# Patient Record
Sex: Female | Born: 1985 | Race: White | Hispanic: No | Marital: Single | Smoking: Current every day smoker
Health system: Southern US, Community
[De-identification: ages and names within clinical notes are randomized; demographics above are authoritative.]

## PROBLEM LIST (undated history)

## (undated) HISTORY — PX: OTHER SURGICAL HISTORY: SHX169

---

## 2010-11-01 ENCOUNTER — Inpatient Hospital Stay (HOSPITAL_COMMUNITY)
Admission: AD | Admit: 2010-11-01 | Discharge: 2010-11-04 | DRG: 775 | Disposition: A | Discharge status: Home / Self Care | Payer: Medicaid Other | Source: Ambulatory Visit | Attending: Obstetrics and Gynecology | Admitting: Obstetrics and Gynecology

## 2010-11-01 LAB — CBC
MCV: 94.7 fL (ref 78.0–100.0)
Platelets: 218 10*3/uL (ref 150–400)
RBC: 3.98 MIL/uL (ref 3.87–5.11)
WBC: 11.4 10*3/uL — ABNORMAL HIGH (ref 4.0–10.5)

## 2010-11-02 LAB — RPR: RPR Ser Ql: NONREACTIVE

## 2010-11-02 LAB — ABO/RH: ABO/RH(D): B NEG

## 2010-11-03 LAB — CBC
Platelets: 231 10*3/uL (ref 150–400)
RBC: 3.49 MIL/uL — ABNORMAL LOW (ref 3.87–5.11)
WBC: 13.6 10*3/uL — ABNORMAL HIGH (ref 4.0–10.5)

## 2010-11-23 ENCOUNTER — Inpatient Hospital Stay (HOSPITAL_COMMUNITY): Admission: AD | Admit: 2010-11-23 | Payer: Self-pay | Source: Home / Self Care | Admitting: Obstetrics & Gynecology

## 2014-08-29 ENCOUNTER — Inpatient Hospital Stay: Payer: Self-pay | Admitting: Surgery

## 2014-08-29 LAB — BASIC METABOLIC PANEL
ANION GAP: 6 — AB (ref 7–16)
BUN: 12 mg/dL (ref 7–18)
CALCIUM: 8.6 mg/dL (ref 8.5–10.1)
CO2: 26 mmol/L (ref 21–32)
CREATININE: 0.73 mg/dL (ref 0.60–1.30)
Chloride: 105 mmol/L (ref 98–107)
EGFR (Non-African Amer.): >60
Glucose: 86 mg/dL (ref 65–99)
OSMOLALITY: 273 (ref 275–301)
Potassium: 3.9 mmol/L (ref 3.5–5.1)
Sodium: 137 mmol/L (ref 136–145)

## 2014-08-29 LAB — TROPONIN I: Troponin-I: <0.02 ng/mL

## 2014-08-29 LAB — CBC
HCT: 41.5 % (ref 35.0–47.0)
HGB: 13.6 g/dL (ref 12.0–16.0)
MCH: 31.9 pg (ref 26.0–34.0)
MCHC: 32.7 g/dL (ref 32.0–36.0)
MCV: 98 fL (ref 80–100)
PLATELETS: 206 10*3/uL (ref 150–440)
RBC: 4.26 10*6/uL (ref 3.80–5.20)
RDW: 14.1 % (ref 11.5–14.5)
WBC: 7.5 10*3/uL (ref 3.6–11.0)

## 2014-09-11 ENCOUNTER — Ambulatory Visit: Payer: Self-pay | Admitting: Cardiothoracic Surgery

## 2014-09-15 ENCOUNTER — Ambulatory Visit: Payer: Self-pay | Admitting: Cardiothoracic Surgery

## 2014-10-14 ENCOUNTER — Ambulatory Visit
Admit: 2014-10-14 | Disposition: A | Discharge status: Home / Self Care | Payer: Self-pay | Attending: Cardiothoracic Surgery | Admitting: Cardiothoracic Surgery

## 2014-12-14 NOTE — H&P (Signed)
PATIENT NAME:  Kendra Horne, Kendra Horne MR#:  [id number] DATE OF BIRTH:  September 26, 1985  DATE OF ADMISSION:  08/29/2014  CHIEF COMPLAINT: Left chest pain and shortness of breath.   HISTORY OF PRESENT ILLNESS: This is a patient who is undergoing what she calls chest pain and points to her apex and back. Denies lateral pain. A workup in the Emergency Room suggested an apical pneumothorax, and I was asked to see the patient for possible chest tube placement.  She describes no prior episode. This started yesterday at 2100 hours acutely. She is a smoker but has not been diagnosed with COPD. She smokes 1/2 pack of cigarettes per day. At this point, she states that she is short of breath but does not appear short of breath. Denies hemoptysis. No abdominal pain.   PAST MEDICAL HISTORY: None.   PAST SURGICAL HISTORY: None.   ALLERGIES: None.   MEDICATIONS: None.   FAMILY HISTORY: No history of pneumothorax or COPD.   SOCIAL HISTORY: The patient works at [employer], smokes 1/2 pack of cigarettes per day, does not drink much alcohol.  REVIEW OF SYSTEMS: A complete system review was performed and negative with the exception of that mentioned in the HPI.   PHYSICAL EXAMINATION:  GENERAL: Healthy, comfortable-appearing female patient. She does not appear in any acute distress and does not appear to be short of breath. Her BMI is 17, 114 pounds, 68 inches tall.  VITAL SIGNS: Show a temperature of 98.7, pulse of 98, respirations 24, blood pressure 119/64, 97% room air saturation. Pain scale of 5.  HEENT: Shows very poor dentition. No scleral icterus.  NECK: No palpable neck nodes. CHEST: Fairly clear to auscultation. There are some rhonchi on the left that clear with coughing. There are diminished breath sounds in the left apex.  CARDIAC: Regular rate and rhythm.  ABDOMEN: Soft, nontender.  EXTREMITIES AND MUSCULOSKELETAL: Within normal limits. Nontender calves.  INTEGUMENT: Shows no jaundice.  NEUROLOGIC:  Grossly intact.   DIAGNOSTIC DATA: Chest x-ray is personally and independently reviewed, showing an apical left pneumothorax.   LABORATORY VALUES: Within normal limits.   ASSESSMENT AND PLAN: This is a patient with a left pneumothorax, spontaneous in nature. It involved mostly the apex. I plan to place a chest tube. I discussed with her and her significant other the rationale for this, the options of observation, the risks of bleeding, infection, inability to completely reinflate the lung, and the need for additional surgery or additional chest tubes. This was all reviewed for her. She understood and agreed to proceed.   ____________________________ Adah Salvageichard E. Excell Seltzerooper, MD rec:ST D: 08/29/2014 21:30:22 ET T: 08/29/2014 22:43:01 ET JOB#: [id number]  cc: Adah Salvageichard E. Excell Seltzerooper, MD, <Dictator> Lattie HawICHARD E Keyonna Comunale MD ELECTRONICALLY SIGNED 08/30/2014 1:00

## 2014-12-14 NOTE — Op Note (Signed)
PATIENT NAME:  Kendra PlaterLANGLEY, Charita MR#:  [id number] DATE OF BIRTH:  1985/11/21  DATE OF PROCEDURE:  08/29/2014  PREOPERATIVE DIAGNOSIS: Left apical pneumothorax.   POSTOPERATIVE DIAGNOSIS: Left apical pneumothorax.  PROCEDURE: Left apical chest tube placement.   SURGEON: Orilla Templeman E. Excell Seltzerooper, MD   ANESTHESIA: Local anesthetic.   INDICATIONS: This is a patient with an acute spontaneous pneumothorax involving mostly the apex. Preoperatively, we discussed rationale for placing the chest tube the options of observation, risk of bleeding, infection, recurrence, inability to completely expand the lung necessitating either surgery or additional chest tube placement. This was all reviewed for her. She understood and agreed to proceed.   FINDINGS: Reinflation of most of the lung post chest film with prompt release of her pneumothorax and improvement of her chest pain and shortness of breath.   DESCRIPTION OF PROCEDURE: The patient was identified in the Emergency Room. Chest x-ray was reviewed personally and independently. A timeout was performed, and then she was prepped and draped in a sterile fashion. Local anesthetic was infiltrated in skin and subcutaneous tissues around the apex near the third rib midclavicular line. The needle was advanced into the pleural cavity with release of air signifying the presence and entry into the pneumothorax.   An incision was made and then a 12 French trocar catheter was placed directly into the pleural cavity and directed towards the apex. It was sutured in place. There was prompt removal of air from the pleural cavity. It was placed to Pleur-evac suction where an air leak was noted. A sterile dressing was placed.   The patient tolerated this procedure well. A postoperative chest film was ordered and personally reviewed. The patient will be admitted to the hospital for continued pneumothorax care with Pleur-evac.    ____________________________ Adah Salvageichard E. Excell Seltzerooper,  MD rec:bm D: 08/29/2014 21:35:39 ET T: 08/30/2014 01:20:04 ET JOB#: [id number]  cc: Adah Salvageichard E. Excell Seltzerooper, MD, <Dictator> Lattie HawICHARD E Henri Baumler MD ELECTRONICALLY SIGNED 08/30/2014 19:31

## 2014-12-14 NOTE — H&P (Signed)
Subjective/Chief Complaint left cp, SOB   History of Present Illness acute onset yest at 2100 SOB, left apical ant chest pain no prior episode   Past History PMH none PSH none   Past Medical Health Smoking   Past Med/Surgical Hx:  Denies:   ALLERGIES:  No Known Allergies:   Family and Social History:  Family History Negative  Smoking   Social History positive  tobacco, negative ETOH, [employer]   + Tobacco Current (within 1 year)   Review of Systems:  Fever/Chills No   Cough Yes   Sputum No   Abdominal Pain No   Diarrhea No   Constipation No   Nausea/Vomiting No   SOB/DOE Yes   Chest Pain Yes   Dysuria No   Tolerating Diet No   Medications/Allergies Reviewed Medications/Allergies reviewed   Physical Exam:  GEN no acute distress, thin   HEENT pink conjunctivae, PERRL, poor dentition   NECK supple   RESP normal resp effort  no use of accessory muscles  rhonchi  dec BS left apex   CARD regular rate   ABD denies tenderness   LYMPH negative neck, negative axillae   EXTR negative edema   SKIN normal to palpation, tattoos   PSYCH alert, A+O to time, place, person, good insight   Lab Results: Routine Chem:  15-Jan-16 15:19   Glucose, Serum 86  BUN 12  Creatinine (comp) 0.73  Sodium, Serum 137  Potassium, Serum 3.9  Chloride, Serum 105  CO2, Serum 26  Calcium (Total), Serum 8.6  Anion Gap  6  Osmolality (calc) 273  eGFR (African American) >60  eGFR (Non-African American) >60 (eGFR values <15mL/min/1.73 m2 may be an indication of chronic kidney disease (CKD). Calculated eGFR, using the MRDR Study equation, is useful in  patients with stable renal function. The eGFR calculation will not be reliable in acutely ill patients when serum creatinine is changing rapidly. It is not useful in patients on dialysis. The eGFR calculation may not be applicable to patients at the low and high extremes of body sizes, pregnant women, and  vegetarians.)  Cardiac:  15-Jan-16 15:19   Troponin I < 0.02 (0.00-0.05 0.05 ng/mL or less: NEGATIVE  Repeat testing in 3-6 hrs  if clinically indicated. >0.05 ng/mL: POTENTIAL  MYOCARDIAL INJURY. Repeat  testing in 3-6 hrs if  clinically indicated. NOTE: An increase or decrease  of 30% or more on serial  testing suggests a  clinically important change)  Routine Hem:  15-Jan-16 15:19   WBC (CBC) 7.5  RBC (CBC) 4.26  Hemoglobin (CBC) 13.6  Hematocrit (CBC) 41.5  Platelet Count (CBC) 206 (Result(s) reported on 29 Aug 2014 at 03:37PM.)  MCV 98  MCH 31.9  MCHC 32.7  RDW 14.1   Radiology Results: XRay:    15-Jan-16 15:49, Chest PA and Lateral  Chest PA and Lateral  REASON FOR EXAM:    chest pain/cough  COMMENTS:       PROCEDURE: DXR - DXR CHEST PA (OR AP) AND LATERAL  - Aug 29 2014  3:49PM     CLINICAL DATA:  Left-sided chest pain which began yesterday. Pain  with movement of the left arm. Current smoker.    EXAM:  CHEST  2 VIEW    COMPARISON:  None.    FINDINGS:  Left apical, lateral and basilar pneumothorax on the order of 30% or  so, without evidence of tension. The left lung is tethered to the  lateral pleura by scarring. Hyperinflation and  emphysematous changes  throughout both lungs. Mild central peribronchial thickening. Lungs  otherwise clear. No pleural effusions. Cardiomediastinal silhouette  unremarkable. Visualized bony thorax intact with slight  thoracolumbar scoliosis convex right.     IMPRESSION:  1. Spontaneous left pneumothorax on the order of 30% or so, without  tension.  2. COPD/emphysema.  No acute cardiopulmonary disease otherwise.      Electronically Signed    By: Evangeline Dakin M.D.    On: 08/29/2014 16:04         Verified By: Deniece Portela, M.D.,    Assessment/Admission Diagnosis personal rev of CXR; left PTX, apical rec CT risks and optiobns rev'd agrees with plan   Electronic Signatures: Florene Glen (MD)   (Signed 15-Jan-16 20:45)  Authored: CHIEF COMPLAINT and HISTORY, PAST MEDICAL/SURGIAL HISTORY, ALLERGIES, FAMILY AND SOCIAL HISTORY, REVIEW OF SYSTEMS, PHYSICAL EXAM, LABS, Radiology, ASSESSMENT AND PLAN   Last Updated: 15-Jan-16 20:45 by Florene Glen (MD)

## 2014-12-14 NOTE — Discharge Summary (Signed)
PATIENT NAME:  Kendra PlaterLANGLEY, Sayuri MR#:  [id number] DATE OF BIRTH:  07/21/1986  DATE OF ADMISSION:  08/29/2014 DATE OF DISCHARGE:  09/02/2014  ADMITTING DIAGNOSIS:  Spontaneous pneumothorax.   DISCHARGE DIAGNOSIS:  Spontaneous pneumothorax.   OPERATION PERFORMED: Insertion of left-sided chest tube.   HOSPITAL COURSE:  Kendra PlaterBrittany Lindseth is a 29 year old white female who was admitted to the hospital on 08/29/2014, after experiencing acute onset of shortness of breath and left-sided chest pain. A chest x-ray confirmed the presence of a large pneumothorax on the left. A chest tube was inserted with prompt re-expansion of her lung. Over the next several days her air leak completely stopped and a chest x-ray with the chest tube on water seal confirmed the lung to be fully expanded. The chest tube was removed and upon removal the patient was discharged to home. At the time of discharge, her chest x-ray showed the lung to be fully inflated. She will follow up with Dr. Thelma Bargeaks in one week. She will obtain a chest x-ray at that time.   DISCHARGE MEDICATIONS: Included acetaminophen, oxycodone 325/10 one tablet orally every four hours as needed.  She was on no other medications.    ____________________________ Sheppard Plumberimothy E. Thelma Bargeaks, MD teo:at D: 09/03/2014 09:40:43 ET T: 09/03/2014 10:07:50 ET JOB#: [id number]  cc: Marcial Pacasimothy E. Thelma Bargeaks, MD, <Dictator> Jasmine DecemberIMOTHY E Tunis Gentle MD ELECTRONICALLY SIGNED 09/03/2014 10:40

## 2016-10-24 IMAGING — CR DG CHEST 1V PORT
1 series · 1 of 1 positions shown · non-contrast
Comparison: One day prior

CLINICAL DATA: Subsequent encounter for chest tube and
pneumothorax.

EXAM:
PORTABLE CHEST - 1 VIEW

[ap]
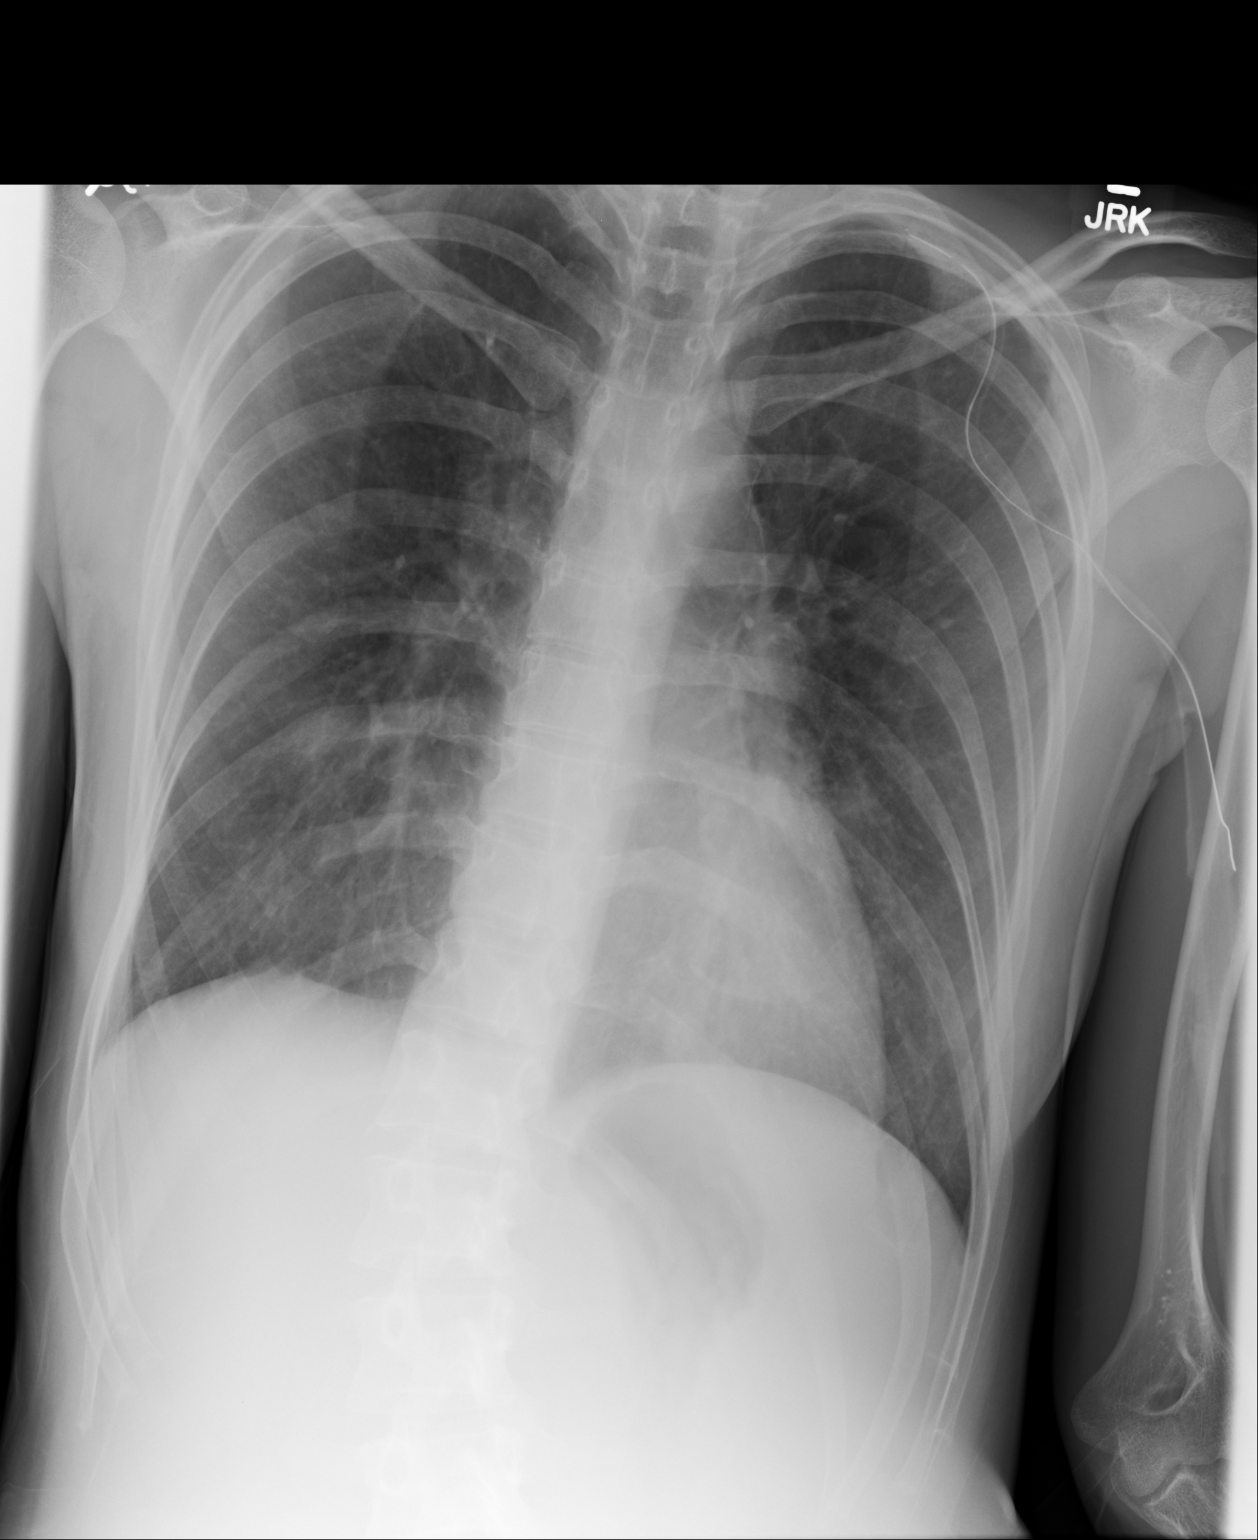

[1 of 1 positions shown; findings below may reference images not displayed]

FINDINGS: Left chest tube is unchanged in position. Tiny left apical and
medial pneumothorax again identified.

Midline trachea. Normal heart size. No pleural fluid. Right apical
pleural thickening. Diffuse peribronchial thickening. No lobar
consolidation.
IMPRESSION: Left chest tube remaining in place with similar tiny left apical
pneumothorax.

Redemonstration of coarsened interstitial markings. Considerations
include age advanced chronic bronchitis related to emphysema and/or
asthma.

## 2016-11-04 IMAGING — CR DG CHEST 2V
1 series · 2 of 2 positions shown · non-contrast
Comparison: Chest x-ray of 09/01/2014

CLINICAL DATA: History of pneumothorax, some shortness of breath
today, former smoking history

EXAM:
CHEST  2 VIEW

[Series 1: dxr chest pa (or ap) and lateral · 0.14mm/px · 2 of 2 slices shown]
[im 1/2]
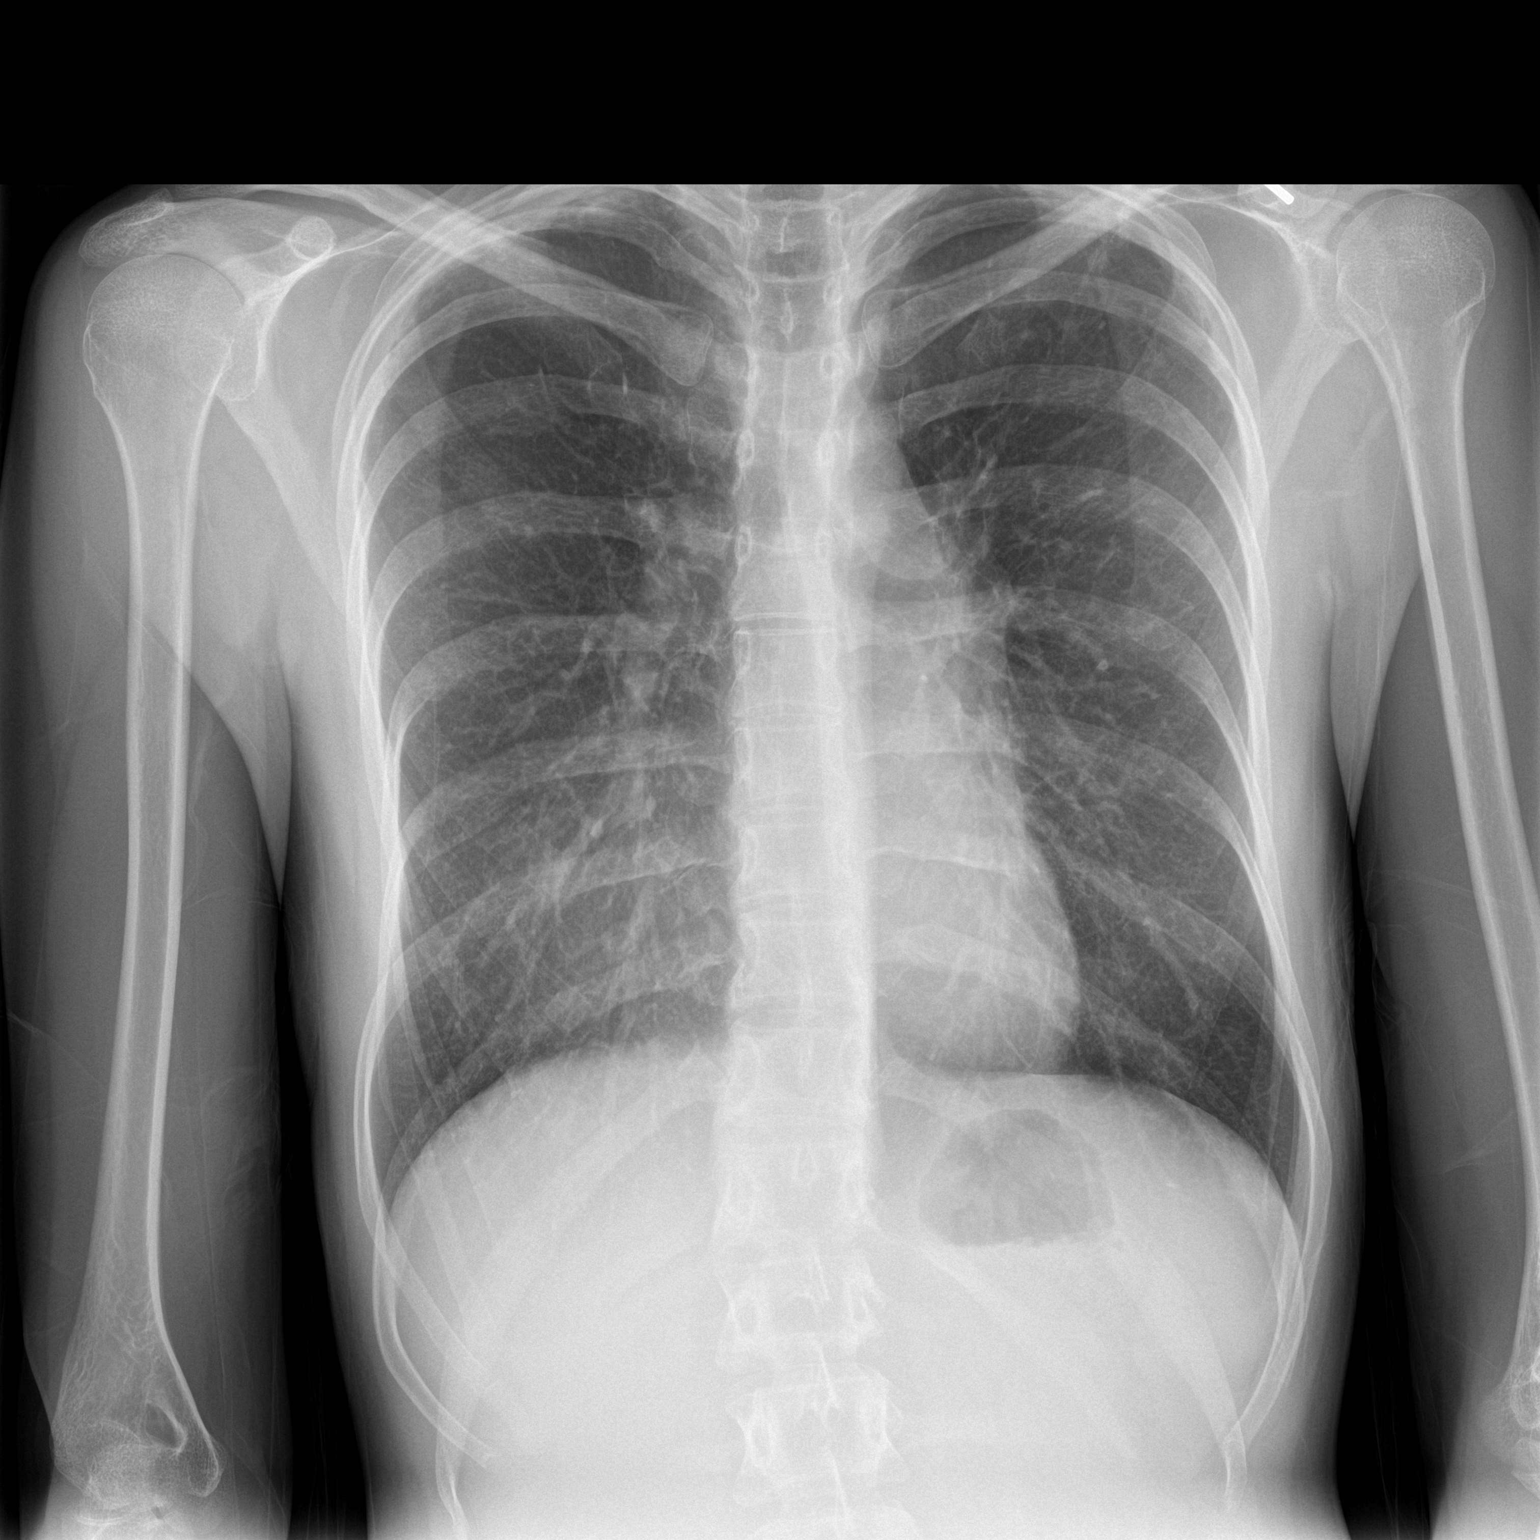
[im 2/2]
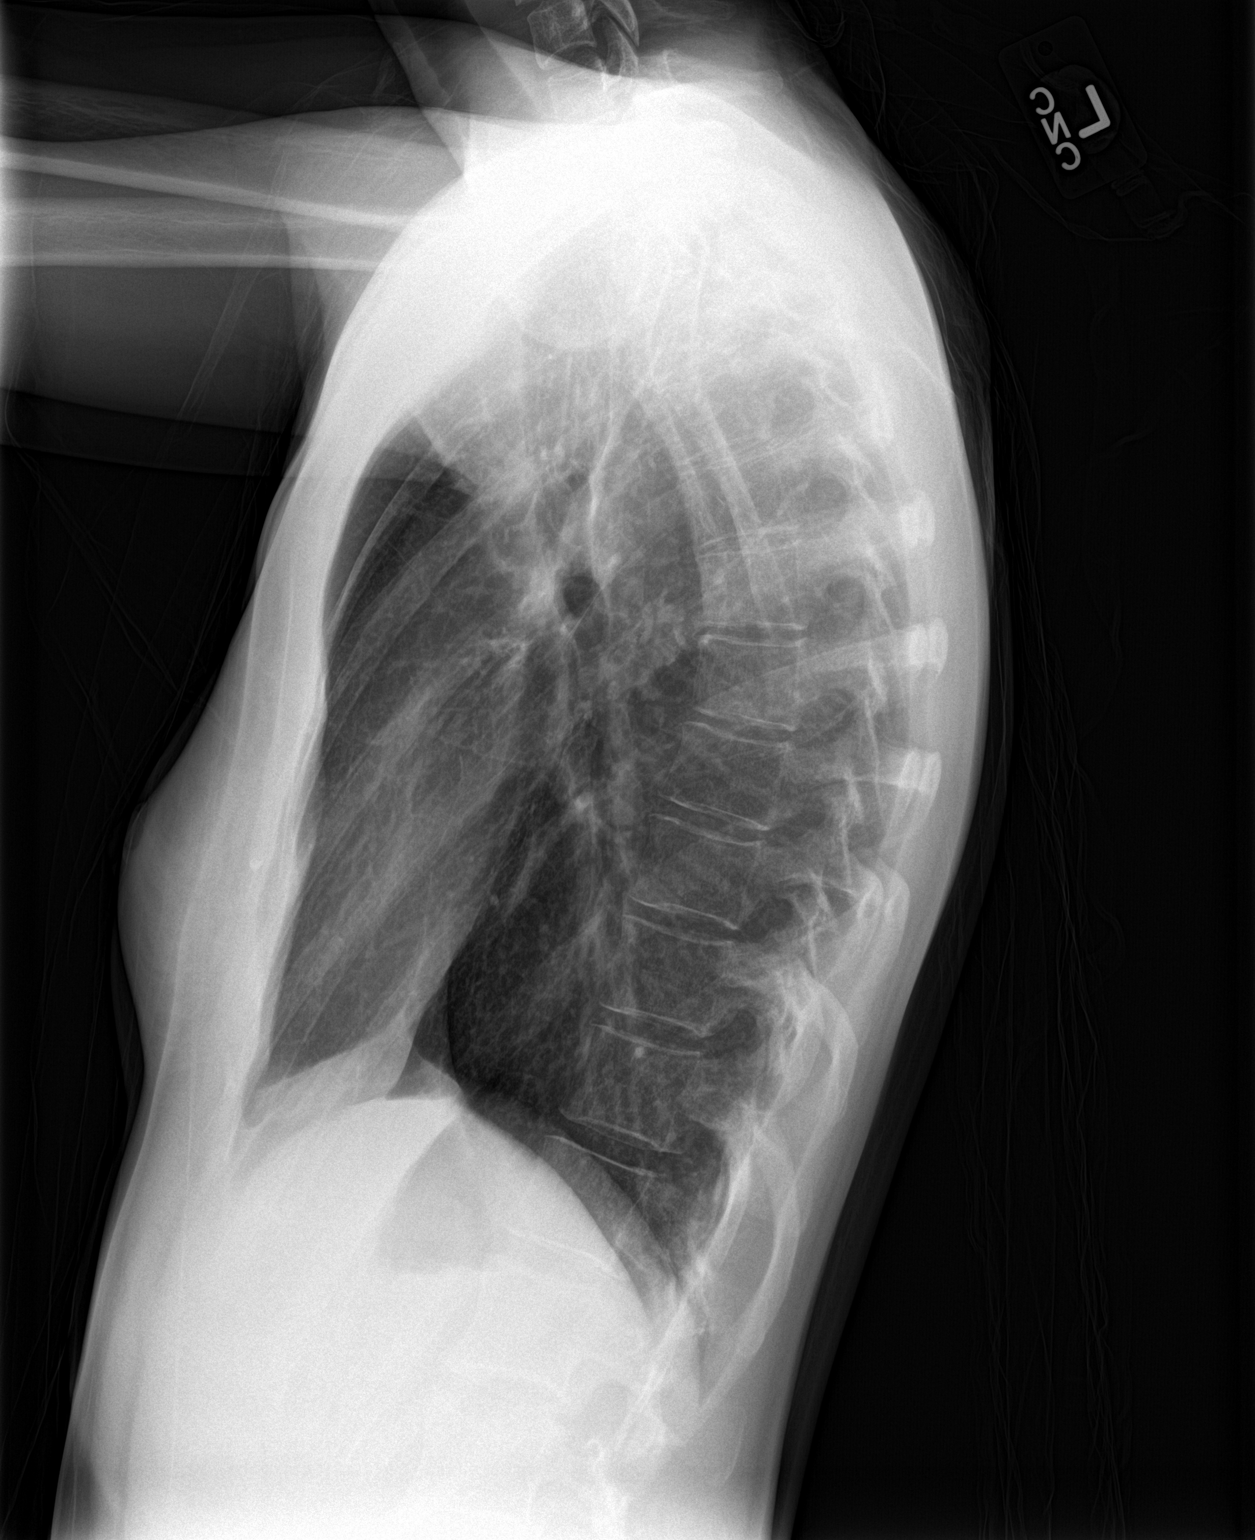

[2 of 2 positions shown; findings below may reference images not displayed]

FINDINGS: No residual pneumothorax is seen. The left chest tube has been
removed. Biapical pleural parenchymal scarring is noted. Mediastinal
and hilar contours are unremarkable and the heart is within normal
limits in size. No bony abnormality is seen.
IMPRESSION: No residual left pneumothorax is noted. The left chest tube has been
removed.

## 2020-08-15 NOTE — L&D Delivery Note (Signed)
Delivery Note Patient had SROM at 0845.  She arrived in [place] at 0920.  She pushed well for 15 minutes. At 9:44 AM a viable female was delivered via Vaginal, Spontaneous (Presentation: Left Occiput Anterior).  APGAR: 8, 9; weight 3229 gm (7lb 1.9oz) .   Placenta status: Spontaneous, Intact.  Cord: 3 vessels with the following complications: None.  Cord pH: n/a  Anesthesia: 15 mL 1% lidocaine Episiotomy: None Lacerations: 2nd degree Suture Repair: 2.0 vicryl rapide Est. Blood Loss (mL):  100 mL  Mom to postpartum.  Baby to Couplet care / Skin to Skin.  Kendra Horne Kendra Horne 04/10/2021, 10:12 AM

## 2020-09-18 LAB — OB RESULTS CONSOLE HIV ANTIBODY (ROUTINE TESTING): HIV: NONREACTIVE

## 2020-09-18 LAB — OB RESULTS CONSOLE ABO/RH: RH Type: NEGATIVE

## 2020-09-18 LAB — OB RESULTS CONSOLE HEPATITIS B SURFACE ANTIGEN: Hepatitis B Surface Ag: NEGATIVE

## 2020-09-18 LAB — OB RESULTS CONSOLE RUBELLA ANTIBODY, IGM: Rubella: IMMUNE

## 2020-09-18 LAB — HEPATITIS C ANTIBODY: HCV Ab: NEGATIVE

## 2020-09-18 LAB — OB RESULTS CONSOLE RPR: RPR: NONREACTIVE

## 2020-09-25 ENCOUNTER — Other Ambulatory Visit: Payer: Self-pay | Admitting: Obstetrics and Gynecology

## 2020-09-25 DIAGNOSIS — Z363 Encounter for antenatal screening for malformations: Secondary | ICD-10-CM

## 2020-11-09 ENCOUNTER — Other Ambulatory Visit: Payer: Self-pay

## 2020-11-09 ENCOUNTER — Ambulatory Visit: Payer: Medicaid Other | Attending: Obstetrics and Gynecology

## 2020-11-09 DIAGNOSIS — O322XX0 Maternal care for transverse and oblique lie, not applicable or unspecified: Secondary | ICD-10-CM | POA: Diagnosis not present

## 2020-11-09 DIAGNOSIS — Z363 Encounter for antenatal screening for malformations: Secondary | ICD-10-CM | POA: Insufficient documentation

## 2020-11-09 DIAGNOSIS — Z3A18 18 weeks gestation of pregnancy: Secondary | ICD-10-CM | POA: Diagnosis not present

## 2021-03-15 LAB — OB RESULTS CONSOLE GC/CHLAMYDIA
Chlamydia: NEGATIVE
Gonorrhea: NEGATIVE

## 2021-03-15 LAB — OB RESULTS CONSOLE GBS: GBS: NEGATIVE

## 2021-04-10 ENCOUNTER — Other Ambulatory Visit: Payer: Self-pay

## 2021-04-10 ENCOUNTER — Inpatient Hospital Stay (HOSPITAL_COMMUNITY)
Admission: AD | Admit: 2021-04-10 | Discharge: 2021-04-11 | DRG: 807 | Disposition: A | Discharge status: Home / Self Care | Payer: Medicaid Other | Attending: Obstetrics | Admitting: Obstetrics

## 2021-04-10 ENCOUNTER — Encounter (HOSPITAL_COMMUNITY): Payer: Self-pay

## 2021-04-10 DIAGNOSIS — Z3A39 39 weeks gestation of pregnancy: Secondary | ICD-10-CM | POA: Diagnosis not present

## 2021-04-10 DIAGNOSIS — Z6791 Unspecified blood type, Rh negative: Secondary | ICD-10-CM | POA: Diagnosis not present

## 2021-04-10 DIAGNOSIS — Z3490 Encounter for supervision of normal pregnancy, unspecified, unspecified trimester: Secondary | ICD-10-CM

## 2021-04-10 DIAGNOSIS — O26893 Other specified pregnancy related conditions, third trimester: Secondary | ICD-10-CM | POA: Diagnosis present

## 2021-04-10 DIAGNOSIS — Z20822 Contact with and (suspected) exposure to covid-19: Secondary | ICD-10-CM | POA: Diagnosis present

## 2021-04-10 DIAGNOSIS — O99334 Smoking (tobacco) complicating childbirth: Secondary | ICD-10-CM | POA: Diagnosis present

## 2021-04-10 LAB — TYPE AND SCREEN
ABO/RH(D): B NEG
Antibody Screen: POSITIVE

## 2021-04-10 LAB — CBC
HCT: 33.9 % — ABNORMAL LOW (ref 36.0–46.0)
Hemoglobin: 11.4 g/dL — ABNORMAL LOW (ref 12.0–15.0)
MCH: 33.3 pg (ref 26.0–34.0)
MCHC: 33.6 g/dL (ref 30.0–36.0)
MCV: 99.1 fL (ref 80.0–100.0)
Platelets: 328 10*3/uL (ref 150–400)
RBC: 3.42 MIL/uL — ABNORMAL LOW (ref 3.87–5.11)
RDW: 13.7 % (ref 11.5–15.5)
WBC: 23.2 10*3/uL — ABNORMAL HIGH (ref 4.0–10.5)
nRBC: 0 % (ref 0.0–0.2)

## 2021-04-10 LAB — RESP PANEL BY RT-PCR (FLU A&B, COVID) ARPGX2
Influenza A by PCR: NEGATIVE
Influenza B by PCR: NEGATIVE
SARS Coronavirus 2 by RT PCR: NEGATIVE

## 2021-04-10 MED ORDER — ONDANSETRON HCL 4 MG PO TABS: 4.0000 mg | ORAL_TABLET | ORAL | Status: DC | PRN

## 2021-04-10 MED ORDER — OXYCODONE-ACETAMINOPHEN 5-325 MG PO TABS: 1.0000 | ORAL_TABLET | ORAL | Status: DC | PRN

## 2021-04-10 MED ORDER — LIDOCAINE HCL (PF) 1 % IJ SOLN: 30.0000 mL | INTRAMUSCULAR | Status: DC | PRN

## 2021-04-10 MED ORDER — OXYTOCIN-SODIUM CHLORIDE 30-0.9 UT/500ML-% IV SOLN
INTRAVENOUS | Status: AC
  Filled 2021-04-10: qty 500

## 2021-04-10 MED ORDER — OXYCODONE HCL 5 MG PO TABS: 10.0000 mg | ORAL_TABLET | ORAL | Status: DC | PRN

## 2021-04-10 MED ORDER — ONDANSETRON HCL 4 MG/2ML IJ SOLN: 4.0000 mg | Freq: Four times a day (QID) | INTRAMUSCULAR | Status: DC | PRN

## 2021-04-10 MED ORDER — SOD CITRATE-CITRIC ACID 500-334 MG/5ML PO SOLN: 30.0000 mL | ORAL | Status: DC | PRN

## 2021-04-10 MED ORDER — DIBUCAINE (PERIANAL) 1 % EX OINT: 1.0000 "application " | TOPICAL_OINTMENT | CUTANEOUS | Status: DC | PRN

## 2021-04-10 MED ORDER — TETANUS-DIPHTH-ACELL PERTUSSIS 5-2.5-18.5 LF-MCG/0.5 IM SUSY: 0.5000 mL | PREFILLED_SYRINGE | Freq: Once | INTRAMUSCULAR | Status: DC

## 2021-04-10 MED ORDER — OXYTOCIN-SODIUM CHLORIDE 30-0.9 UT/500ML-% IV SOLN
2.5000 [IU]/h | INTRAVENOUS | Status: DC
  Administered 2021-04-10: 2.5 [IU]/h via INTRAVENOUS

## 2021-04-10 MED ORDER — WITCH HAZEL-GLYCERIN EX PADS: 1.0000 "application " | MEDICATED_PAD | CUTANEOUS | Status: DC | PRN

## 2021-04-10 MED ORDER — OXYTOCIN BOLUS FROM INFUSION
333.0000 mL | Freq: Once | INTRAVENOUS | Status: AC
  Administered 2021-04-10: 333 mL via INTRAVENOUS

## 2021-04-10 MED ORDER — PRENATAL MULTIVITAMIN CH
1.0000 | ORAL_TABLET | Freq: Every day | ORAL | Status: DC
  Administered 2021-04-11: 1 via ORAL
  Filled 2021-04-10: qty 1

## 2021-04-10 MED ORDER — LACTATED RINGERS IV SOLN: INTRAVENOUS | Status: DC

## 2021-04-10 MED ORDER — LIDOCAINE HCL (PF) 1 % IJ SOLN
INTRAMUSCULAR | Status: AC
  Administered 2021-04-10: 30 mL
  Filled 2021-04-10: qty 30

## 2021-04-10 MED ORDER — DIPHENHYDRAMINE HCL 25 MG PO CAPS: 25.0000 mg | ORAL_CAPSULE | Freq: Four times a day (QID) | ORAL | Status: DC | PRN

## 2021-04-10 MED ORDER — ACETAMINOPHEN 325 MG PO TABS: 650.0000 mg | ORAL_TABLET | ORAL | Status: DC | PRN

## 2021-04-10 MED ORDER — OXYCODONE-ACETAMINOPHEN 5-325 MG PO TABS: 2.0000 | ORAL_TABLET | ORAL | Status: DC | PRN

## 2021-04-10 MED ORDER — IBUPROFEN 600 MG PO TABS
600.0000 mg | ORAL_TABLET | Freq: Four times a day (QID) | ORAL | Status: DC
  Administered 2021-04-10 – 2021-04-11 (×5): 600 mg via ORAL
  Filled 2021-04-10 (×5): qty 1

## 2021-04-10 MED ORDER — FLEET ENEMA 7-19 GM/118ML RE ENEM: 1.0000 | ENEMA | RECTAL | Status: DC | PRN

## 2021-04-10 MED ORDER — SIMETHICONE 80 MG PO CHEW: 80.0000 mg | CHEWABLE_TABLET | ORAL | Status: DC | PRN

## 2021-04-10 MED ORDER — LACTATED RINGERS IV SOLN: 500.0000 mL | INTRAVENOUS | Status: DC | PRN

## 2021-04-10 MED ORDER — ONDANSETRON HCL 4 MG/2ML IJ SOLN: 4.0000 mg | INTRAMUSCULAR | Status: DC | PRN

## 2021-04-10 MED ORDER — BENZOCAINE-MENTHOL 20-0.5 % EX AERO
1.0000 "application " | INHALATION_SPRAY | CUTANEOUS | Status: DC | PRN
  Administered 2021-04-10: 1 via TOPICAL
  Filled 2021-04-10: qty 56

## 2021-04-10 MED ORDER — OXYCODONE HCL 5 MG PO TABS: 5.0000 mg | ORAL_TABLET | ORAL | Status: DC | PRN

## 2021-04-10 MED ORDER — COCONUT OIL OIL: 1.0000 "application " | TOPICAL_OIL | Status: DC | PRN

## 2021-04-10 MED ORDER — SENNOSIDES-DOCUSATE SODIUM 8.6-50 MG PO TABS: 2.0000 | ORAL_TABLET | ORAL | Status: DC

## 2021-04-10 NOTE — MAU Note (Addendum)
Pt arrived by EMS, 2nd baby, due tomorrow.  Hx of uncomplicated term preg. Denies problems with current preg.  SROM at 0845, dk blood noted in clear fluid.  +FH noted on monitor after exam. CNM callled to bedside. Pt wanting to push, would not let complete SVE due to discomfort

## 2021-04-10 NOTE — H&P (Signed)
35 y.o. G2P1 @ [redacted]w[redacted]d presents to MAU via EMS with c/o urge to push.  SROM 35 minutes prior to arrival.  On arrival to L&D she was 10/100/+1.  Otherwise has good fetal movement and no bleeding.  Pregnancy c/b: History of PTB at [redacted]w[redacted]d with G1.   Smoker  History reviewed. No pertinent past medical history. History reviewed. No pertinent surgical history.  OB History  Gravida Para Term Preterm AB Living  2 1       1   SAB IAB Ectopic Multiple Live Births          1    # Outcome Date GA Lbr Len/2nd Weight Sex Delivery Anes PTL Lv  2 Current           1 Para 11/03/10     Vag-Spont  Y LIV    Social History   Socioeconomic History   Marital status: Single    Spouse name: Not on file   Number of children: Not on file   Years of education: Not on file   Highest education level: Not on file  Occupational History   Not on file  Tobacco Use   Smoking status: Not on file   Smokeless tobacco: Not on file  Substance and Sexual Activity   Alcohol use: Not on file   Drug use: Not on file   Sexual activity: Yes    Birth control/protection: None  Other Topics Concern   Not on file  Social History Narrative   Not on file   Social Determinants of Health   Financial Resource Strain: Not on file  Food Insecurity: Not on file  Transportation Needs: Not on file  Physical Activity: Not on file  Stress: Not on file  Social Connections: Not on file  Intimate Partner Violence: Not on file   Patient has no allergy information on record.    Prenatal Transfer Tool  Maternal Diabetes: No Genetic Screening: Declined Maternal Ultrasounds/Referrals: Normal Fetal Ultrasounds or other Referrals:  None Maternal Substance Abuse:  Yes:  Type: Smoker Significant Maternal Medications:  None Significant Maternal Lab Results: Group B Strep negative  ABO, Rh: B/Negative/-- (02/04 0000) Antibody:  admit pending, neg in pregnancy Rubella: Immune (02/04 0000) RPR: Nonreactive (02/04 0000)  HBsAg:  Negative (02/04 0000)  HIV: Non-reactive (02/04 0000)  GBS: Negative/-- (08/01 0000)    Vitals:   04/10/21 1016 04/10/21 1030  BP: 111/64 (!) 116/57  Pulse: 85 84  Resp: 16 18     General:  NAD Abdomen:  gravid Ex:  no edema SVE:  10/100/+1 FHTs:  140s, moderate variability, variable decels w pushing Toco:  q7 minutes   A/P   35 y.o. G2P1 [redacted]w[redacted]d presents with advanced labor Delivered shortly after arrival to L&D without epidural.   See delivery note for details  FSR/ vtx/ GBS neg  [facility] GEFFEL Kenyatta Gloeckner

## 2021-04-11 LAB — RPR: RPR Ser Ql: NONREACTIVE

## 2021-04-11 LAB — CBC
HCT: 30.5 % — ABNORMAL LOW (ref 36.0–46.0)
Hemoglobin: 10 g/dL — ABNORMAL LOW (ref 12.0–15.0)
MCH: 32.5 pg (ref 26.0–34.0)
MCHC: 32.8 g/dL (ref 30.0–36.0)
MCV: 99 fL (ref 80.0–100.0)
Platelets: 307 10*3/uL (ref 150–400)
RBC: 3.08 MIL/uL — ABNORMAL LOW (ref 3.87–5.11)
RDW: 13.7 % (ref 11.5–15.5)
WBC: 18.4 10*3/uL — ABNORMAL HIGH (ref 4.0–10.5)
nRBC: 0 % (ref 0.0–0.2)

## 2021-04-11 MED ORDER — RHO D IMMUNE GLOBULIN 1500 UNIT/2ML IJ SOSY
300.0000 ug | PREFILLED_SYRINGE | Freq: Once | INTRAMUSCULAR | Status: AC
  Administered 2021-04-11: 300 ug via INTRAVENOUS
  Filled 2021-04-11: qty 2

## 2021-04-11 NOTE — Discharge Summary (Signed)
Postpartum Discharge Summary  Date of Service updated 04/11/21     Patient Name: Kendra Horne DOB: 1986-04-04 MRN: [id number]  Date of admission: 04/10/2021 Delivery date:04/10/2021  Delivering provider: Marlow Baars  Date of discharge: 04/11/2021  Admitting diagnosis: Pregnancy [Z34.90] Normal labor [O80, Z37.9] Intrauterine pregnancy: [redacted]w[redacted]d     Secondary diagnosis:  Active Problems:   Pregnancy   Normal labor  Additional problems: None    Discharge diagnosis: Term Pregnancy Delivered                                              Post partum procedures:rhogam Augmentation: N/A Complications: None  Hospital course: Onset of Labor With Vaginal Delivery      35 y.o. yo G2P1002 at [redacted]w[redacted]d was admitted in Active Labor on 04/10/2021. Patient had an uncomplicated labor course as follows:  Membrane Rupture Time/Date: 8:45 AM ,04/10/2021   Delivery Method:Vaginal, Spontaneous  Episiotomy: None  Lacerations:  2nd degree  Patient had an uncomplicated postpartum course.  She is ambulating, tolerating a regular diet, passing flatus, and urinating well. Patient is discharged home in stable condition on 04/11/21.  Newborn Data: Birth date:04/10/2021  Birth time:9:44 AM  Gender:Female  Living status:Living  Apgars:8 ,9  Weight:3229 g    Rhophylac:Yes   Physical exam  Vitals:   04/10/21 1700 04/10/21 2059 04/11/21 0024 04/11/21 0615  BP: 106/71 104/62 (!) 100/57 103/61  Pulse: 76 83 70 82  Resp: 18 16 16 16   Temp: 98.3 F (36.8 C) 98.4 F (36.9 C) 98.5 F (36.9 C) 98.1 F (36.7 C)  TempSrc: Axillary Oral Oral Oral  SpO2:  99% 98% 98%   General: alert, cooperative, and no distress Lochia: appropriate Uterine Fundus: firm DVT Evaluation: No evidence of DVT seen on physical exam. Labs: Lab Results  Component Value Date   WBC 18.4 (H) 04/11/2021   HGB 10.0 (L) 04/11/2021   HCT 30.5 (L) 04/11/2021   MCV 99.0 04/11/2021   PLT 307 04/11/2021   CMP Latest Ref Rng &  Units 08/29/2014  Glucose 65 - 99 mg/dL 86  BUN 7 - 18 mg/dL 12  Creatinine 08/31/2014 - 3.71 mg/dL 0.62  Sodium 6.94 - 854 mmol/L 137  Potassium 3.5 - 5.1 mmol/L 3.9  Chloride 98 - 107 mmol/L 105  CO2 21 - 32 mmol/L 26  Calcium 8.5 - 10.1 mg/dL 8.6   Edinburgh Score: Edinburgh Postnatal Depression Scale Screening Tool 04/11/2021  I have been able to laugh and see the funny side of things. 0  I have looked forward with enjoyment to things. 0  I have blamed myself unnecessarily when things went wrong. 0  I have been anxious or worried for no good reason. 0  I have felt scared or panicky for no good reason. 0  Things have been getting on top of me. 1  I have been so unhappy that I have had difficulty sleeping. 0  I have felt sad or miserable. 0  I have been so unhappy that I have been crying. 0  The thought of harming myself has occurred to me. 0  Edinburgh Postnatal Depression Scale Total 1      After visit meds:  Allergies as of 04/11/2021   Not on File      Medication List    You have not been prescribed any medications.  Discharge home in stable condition Infant Feeding: Bottle Infant Disposition:home with mother Discharge instruction: per After Visit Summary and Postpartum booklet. Activity: Advance as tolerated. Pelvic rest for 6 weeks.  Diet: routine diet  Postpartum Appointment:4 weeks  Future Appointments:No future appointments. Follow up Visit:  Follow-up Information     Marlow Baars, MD Follow up in 4 week(s).   Specialty: Obstetrics Contact information: [address] [place] [address] [phone]                     04/11/2021 [facility] Lizabeth Leyden, MD

## 2021-04-11 NOTE — Progress Notes (Signed)
Patient is doing well.  She is ambulating, voiding, tolerating PO.  Pain control is good.  Lochia is appropriate  Vitals:   04/10/21 1700 04/10/21 2059 04/11/21 0024 04/11/21 0615  BP: 106/71 104/62 (!) 100/57 103/61  Pulse: 76 83 70 82  Resp: 18 16 16 16   Temp: 98.3 F (36.8 C) 98.4 F (36.9 C) 98.5 F (36.9 C) 98.1 F (36.7 C)  TempSrc: Axillary Oral Oral Oral  SpO2:  99% 98% 98%    NAD Fundus firm Ext: no edema  Lab Results  Component Value Date   WBC 18.4 (H) 04/11/2021   HGB 10.0 (L) 04/11/2021   HCT 30.5 (L) 04/11/2021   MCV 99.0 04/11/2021   PLT 307 04/11/2021    --/--/B NEG (08/27 1020)/RImmune  A/P 34 y.o. G2P1002 PPD#1. Routine care.   Rh negative, baby Rh positive. Rhogam prior to discharge Meeting all goals.  Discharge to home today. 05-14-1990    [facility]

## 2021-04-12 LAB — RH IG WORKUP (INCLUDES ABO/RH)
Fetal Screen: NEGATIVE
Gestational Age(Wks): 39
Unit division: 0

## 2021-04-14 ENCOUNTER — Encounter (HOSPITAL_COMMUNITY): Payer: Medicaid Other

## 2021-04-14 ENCOUNTER — Inpatient Hospital Stay (HOSPITAL_COMMUNITY)
Admission: AD | Admit: 2021-04-14 | Payer: Medicaid Other | Source: Home / Self Care | Admitting: Obstetrics and Gynecology

## 2021-04-14 ENCOUNTER — Inpatient Hospital Stay (HOSPITAL_COMMUNITY): Payer: Medicaid Other

## 2021-04-22 ENCOUNTER — Telehealth (HOSPITAL_COMMUNITY): Payer: Self-pay | Admitting: *Deleted

## 2021-04-22 NOTE — Telephone Encounter (Signed)
Left message to return nurse call.  Duffy Rhody, RN 04-22-2021 at 2:48p

## 2022-02-21 LAB — OB RESULTS CONSOLE RUBELLA ANTIBODY, IGM: Rubella: IMMUNE

## 2022-02-21 LAB — OB RESULTS CONSOLE ABO/RH: RH Type: NEGATIVE

## 2022-02-21 LAB — OB RESULTS CONSOLE GC/CHLAMYDIA
Chlamydia: NEGATIVE
Neisseria Gonorrhea: NEGATIVE

## 2022-02-21 LAB — OB RESULTS CONSOLE HEPATITIS B SURFACE ANTIGEN: Hepatitis B Surface Ag: NEGATIVE

## 2022-02-21 LAB — OB RESULTS CONSOLE ANTIBODY SCREEN: Antibody Screen: NEGATIVE

## 2022-02-21 LAB — HEPATITIS C ANTIBODY: HCV Ab: NEGATIVE

## 2022-02-21 LAB — OB RESULTS CONSOLE RPR: RPR: NONREACTIVE

## 2022-02-21 LAB — OB RESULTS CONSOLE HIV ANTIBODY (ROUTINE TESTING): HIV: NONREACTIVE

## 2022-04-24 ENCOUNTER — Other Ambulatory Visit: Payer: Self-pay

## 2022-05-31 ENCOUNTER — Other Ambulatory Visit: Payer: Self-pay | Admitting: Obstetrics and Gynecology

## 2022-05-31 DIAGNOSIS — Z363 Encounter for antenatal screening for malformations: Secondary | ICD-10-CM

## 2022-06-06 ENCOUNTER — Encounter: Payer: Self-pay | Admitting: *Deleted

## 2022-06-08 ENCOUNTER — Other Ambulatory Visit: Payer: Self-pay | Admitting: *Deleted

## 2022-06-08 ENCOUNTER — Encounter: Payer: Self-pay | Admitting: *Deleted

## 2022-06-08 ENCOUNTER — Ambulatory Visit: Payer: Medicaid Other | Admitting: *Deleted

## 2022-06-08 ENCOUNTER — Ambulatory Visit: Payer: Medicaid Other | Attending: Obstetrics and Gynecology

## 2022-06-08 VITALS — BP 98/56 | HR 96 | Ht 68.0 in

## 2022-06-08 DIAGNOSIS — Z3689 Encounter for other specified antenatal screening: Secondary | ICD-10-CM | POA: Insufficient documentation

## 2022-06-08 DIAGNOSIS — Z363 Encounter for antenatal screening for malformations: Secondary | ICD-10-CM | POA: Diagnosis present

## 2022-06-08 DIAGNOSIS — O09522 Supervision of elderly multigravida, second trimester: Secondary | ICD-10-CM

## 2022-06-08 DIAGNOSIS — O99332 Smoking (tobacco) complicating pregnancy, second trimester: Secondary | ICD-10-CM

## 2022-06-08 DIAGNOSIS — O09899 Supervision of other high risk pregnancies, unspecified trimester: Secondary | ICD-10-CM

## 2022-07-20 ENCOUNTER — Other Ambulatory Visit: Payer: Self-pay | Admitting: *Deleted

## 2022-07-20 ENCOUNTER — Ambulatory Visit: Payer: Medicaid Other | Attending: Obstetrics and Gynecology

## 2022-07-20 ENCOUNTER — Ambulatory Visit: Payer: Medicaid Other | Admitting: *Deleted

## 2022-07-20 VITALS — BP 101/49 | HR 87

## 2022-07-20 DIAGNOSIS — O99332 Smoking (tobacco) complicating pregnancy, second trimester: Secondary | ICD-10-CM | POA: Insufficient documentation

## 2022-07-20 DIAGNOSIS — O3660X0 Maternal care for excessive fetal growth, unspecified trimester, not applicable or unspecified: Secondary | ICD-10-CM

## 2022-07-20 DIAGNOSIS — Z3A30 30 weeks gestation of pregnancy: Secondary | ICD-10-CM

## 2022-07-20 DIAGNOSIS — Z362 Encounter for other antenatal screening follow-up: Secondary | ICD-10-CM

## 2022-07-20 DIAGNOSIS — O09893 Supervision of other high risk pregnancies, third trimester: Secondary | ICD-10-CM

## 2022-07-20 DIAGNOSIS — O09899 Supervision of other high risk pregnancies, unspecified trimester: Secondary | ICD-10-CM | POA: Insufficient documentation

## 2022-07-20 DIAGNOSIS — F17210 Nicotine dependence, cigarettes, uncomplicated: Secondary | ICD-10-CM

## 2022-07-20 DIAGNOSIS — O09213 Supervision of pregnancy with history of pre-term labor, third trimester: Secondary | ICD-10-CM | POA: Diagnosis not present

## 2022-07-20 DIAGNOSIS — O09522 Supervision of elderly multigravida, second trimester: Secondary | ICD-10-CM | POA: Insufficient documentation

## 2022-07-20 DIAGNOSIS — O09523 Supervision of elderly multigravida, third trimester: Secondary | ICD-10-CM

## 2022-07-20 DIAGNOSIS — O358XX0 Maternal care for other (suspected) fetal abnormality and damage, not applicable or unspecified: Secondary | ICD-10-CM | POA: Diagnosis not present

## 2022-07-20 DIAGNOSIS — Z3689 Encounter for other specified antenatal screening: Secondary | ICD-10-CM

## 2022-07-20 DIAGNOSIS — O3663X0 Maternal care for excessive fetal growth, third trimester, not applicable or unspecified: Secondary | ICD-10-CM

## 2022-07-20 DIAGNOSIS — O99333 Smoking (tobacco) complicating pregnancy, third trimester: Secondary | ICD-10-CM

## 2022-08-15 NOTE — L&D Delivery Note (Signed)
Delivery Note At 7:18 AM a viable and healthy female was delivered via Vaginal, Spontaneous (Presentation: Left Occiput Anterior).  APGAR: 9, 9; weight pending .   Placenta status: Spontaneous, Intact.  Cord: 3 vessels   Pt presented to L&D completely dilated. She delivered a vigorous female infant in the vertex LOA presentation with apgar scores of 9 at 1 minute and 9 at 5 minutes. FOllowing a 1 minute delay, the cord was clamped and cut. The placenta delivered spontaneously, intact, w/ 3VC. The perineum was infiltrated with 1% lidocaine. The 2nd degree perineal laceration was repaired with 3-0 vicryl. All sponge, instrument, and needle counts were correct. Mom and baby doing well following delivery.  Anesthesia: None, 1% lidocaine Episiotomy: None Lacerations: 2nd degree Suture Repair: 3.0 vicryl Est. Blood Loss (mL): 24  Mom to postpartum.  Baby to Couplet care / Skin to Skin.  Vanessa Kick 09/17/2022, 7:53 AM

## 2022-08-26 ENCOUNTER — Ambulatory Visit: Payer: Medicaid Other | Attending: Obstetrics

## 2022-08-26 ENCOUNTER — Ambulatory Visit: Payer: Medicaid Other | Admitting: *Deleted

## 2022-08-26 VITALS — BP 101/55 | HR 88

## 2022-08-26 DIAGNOSIS — Z3A35 35 weeks gestation of pregnancy: Secondary | ICD-10-CM

## 2022-08-26 DIAGNOSIS — O09899 Supervision of other high risk pregnancies, unspecified trimester: Secondary | ICD-10-CM | POA: Insufficient documentation

## 2022-08-26 DIAGNOSIS — Z3689 Encounter for other specified antenatal screening: Secondary | ICD-10-CM | POA: Insufficient documentation

## 2022-08-26 DIAGNOSIS — O09293 Supervision of pregnancy with other poor reproductive or obstetric history, third trimester: Secondary | ICD-10-CM | POA: Diagnosis not present

## 2022-08-26 DIAGNOSIS — O09523 Supervision of elderly multigravida, third trimester: Secondary | ICD-10-CM | POA: Diagnosis not present

## 2022-08-26 DIAGNOSIS — O3663X0 Maternal care for excessive fetal growth, third trimester, not applicable or unspecified: Secondary | ICD-10-CM | POA: Diagnosis present

## 2022-08-26 DIAGNOSIS — Z8349 Family history of other endocrine, nutritional and metabolic diseases: Secondary | ICD-10-CM

## 2022-08-26 DIAGNOSIS — O09213 Supervision of pregnancy with history of pre-term labor, third trimester: Secondary | ICD-10-CM

## 2022-08-26 DIAGNOSIS — O99333 Smoking (tobacco) complicating pregnancy, third trimester: Secondary | ICD-10-CM

## 2022-08-31 LAB — OB RESULTS CONSOLE GBS: GBS: NEGATIVE

## 2022-09-12 ENCOUNTER — Encounter (HOSPITAL_COMMUNITY): Payer: Self-pay

## 2022-09-12 ENCOUNTER — Telehealth (HOSPITAL_COMMUNITY): Payer: Self-pay | Admitting: *Deleted

## 2022-09-12 NOTE — Telephone Encounter (Signed)
Preadmission screen  

## 2022-09-14 ENCOUNTER — Telehealth (HOSPITAL_COMMUNITY): Payer: Self-pay | Admitting: *Deleted

## 2022-09-14 NOTE — Telephone Encounter (Signed)
Preadmission screen  

## 2022-09-15 ENCOUNTER — Telehealth (HOSPITAL_COMMUNITY): Payer: Self-pay | Admitting: *Deleted

## 2022-09-15 NOTE — Telephone Encounter (Signed)
Preadmission screen  

## 2022-09-16 ENCOUNTER — Telehealth (HOSPITAL_COMMUNITY): Payer: Self-pay | Admitting: *Deleted

## 2022-09-16 NOTE — Telephone Encounter (Signed)
Preadmission screen  

## 2022-09-17 ENCOUNTER — Encounter (HOSPITAL_COMMUNITY): Payer: Self-pay | Admitting: Obstetrics

## 2022-09-17 ENCOUNTER — Inpatient Hospital Stay (HOSPITAL_COMMUNITY)
Admission: AD | Admit: 2022-09-17 | Discharge: 2022-09-18 | DRG: 807 | Disposition: A | Discharge status: Home / Self Care | Payer: Medicaid Other | Attending: Obstetrics and Gynecology | Admitting: Obstetrics and Gynecology

## 2022-09-17 ENCOUNTER — Inpatient Hospital Stay (HOSPITAL_COMMUNITY)
Admission: AD | Admit: 2022-09-17 | Payer: Medicaid Other | Source: Home / Self Care | Admitting: Obstetrics and Gynecology

## 2022-09-17 DIAGNOSIS — O3663X0 Maternal care for excessive fetal growth, third trimester, not applicable or unspecified: Secondary | ICD-10-CM | POA: Diagnosis present

## 2022-09-17 DIAGNOSIS — Z6791 Unspecified blood type, Rh negative: Secondary | ICD-10-CM

## 2022-09-17 DIAGNOSIS — O3660X0 Maternal care for excessive fetal growth, unspecified trimester, not applicable or unspecified: Principal | ICD-10-CM | POA: Diagnosis present

## 2022-09-17 DIAGNOSIS — O99334 Smoking (tobacco) complicating childbirth: Secondary | ICD-10-CM | POA: Diagnosis present

## 2022-09-17 DIAGNOSIS — Z3A38 38 weeks gestation of pregnancy: Secondary | ICD-10-CM | POA: Diagnosis not present

## 2022-09-17 DIAGNOSIS — F17210 Nicotine dependence, cigarettes, uncomplicated: Secondary | ICD-10-CM | POA: Diagnosis present

## 2022-09-17 DIAGNOSIS — Z148 Genetic carrier of other disease: Secondary | ICD-10-CM | POA: Diagnosis not present

## 2022-09-17 DIAGNOSIS — O26893 Other specified pregnancy related conditions, third trimester: Secondary | ICD-10-CM | POA: Diagnosis present

## 2022-09-17 LAB — CBC
HCT: 36.2 % (ref 36.0–46.0)
Hemoglobin: 12 g/dL (ref 12.0–15.0)
MCH: 32.2 pg (ref 26.0–34.0)
MCHC: 33.1 g/dL (ref 30.0–36.0)
MCV: 97.1 fL (ref 80.0–100.0)
Platelets: 284 10*3/uL (ref 150–400)
RBC: 3.73 MIL/uL — ABNORMAL LOW (ref 3.87–5.11)
RDW: 13.8 % (ref 11.5–15.5)
WBC: 15.1 10*3/uL — ABNORMAL HIGH (ref 4.0–10.5)
nRBC: 0 % (ref 0.0–0.2)

## 2022-09-17 LAB — TYPE AND SCREEN
ABO/RH(D): B NEG
Antibody Screen: POSITIVE

## 2022-09-17 MED ORDER — BENZOCAINE-MENTHOL 20-0.5 % EX AERO
1.0000 | INHALATION_SPRAY | CUTANEOUS | Status: DC | PRN
  Administered 2022-09-17: 1 via TOPICAL
  Filled 2022-09-17: qty 56

## 2022-09-17 MED ORDER — OXYTOCIN-SODIUM CHLORIDE 30-0.9 UT/500ML-% IV SOLN
INTRAVENOUS | Status: AC
  Administered 2022-09-17: 333 mL via INTRAVENOUS
  Filled 2022-09-17: qty 500

## 2022-09-17 MED ORDER — SIMETHICONE 80 MG PO CHEW: 80.0000 mg | CHEWABLE_TABLET | ORAL | Status: DC | PRN

## 2022-09-17 MED ORDER — WITCH HAZEL-GLYCERIN EX PADS: 1.0000 | MEDICATED_PAD | CUTANEOUS | Status: DC | PRN

## 2022-09-17 MED ORDER — LIDOCAINE HCL (PF) 1 % IJ SOLN
30.0000 mL | INTRAMUSCULAR | Status: AC | PRN
  Administered 2022-09-17: 30 mL via SUBCUTANEOUS
  Filled 2022-09-17: qty 30

## 2022-09-17 MED ORDER — TETANUS-DIPHTH-ACELL PERTUSSIS 5-2.5-18.5 LF-MCG/0.5 IM SUSY: 0.5000 mL | PREFILLED_SYRINGE | Freq: Once | INTRAMUSCULAR | Status: DC

## 2022-09-17 MED ORDER — PRENATAL MULTIVITAMIN CH
1.0000 | ORAL_TABLET | Freq: Every day | ORAL | Status: DC
  Administered 2022-09-17 – 2022-09-18 (×2): 1 via ORAL
  Filled 2022-09-17 (×2): qty 1

## 2022-09-17 MED ORDER — LACTATED RINGERS IV SOLN: 500.0000 mL | INTRAVENOUS | Status: DC | PRN

## 2022-09-17 MED ORDER — ONDANSETRON HCL 4 MG/2ML IJ SOLN: 4.0000 mg | Freq: Four times a day (QID) | INTRAMUSCULAR | Status: DC | PRN

## 2022-09-17 MED ORDER — METHYLERGONOVINE MALEATE 0.2 MG PO TABS: 0.2000 mg | ORAL_TABLET | ORAL | Status: DC | PRN

## 2022-09-17 MED ORDER — SENNOSIDES-DOCUSATE SODIUM 8.6-50 MG PO TABS
2.0000 | ORAL_TABLET | Freq: Every day | ORAL | Status: DC
  Administered 2022-09-18: 2 via ORAL
  Filled 2022-09-17: qty 2

## 2022-09-17 MED ORDER — FENTANYL CITRATE (PF) 100 MCG/2ML IJ SOLN: 50.0000 ug | INTRAMUSCULAR | Status: DC | PRN

## 2022-09-17 MED ORDER — COCONUT OIL OIL: 1.0000 | TOPICAL_OIL | Status: DC | PRN

## 2022-09-17 MED ORDER — ZOLPIDEM TARTRATE 5 MG PO TABS: 5.0000 mg | ORAL_TABLET | Freq: Every evening | ORAL | Status: DC | PRN

## 2022-09-17 MED ORDER — OXYTOCIN-SODIUM CHLORIDE 30-0.9 UT/500ML-% IV SOLN: 1.0000 m[IU]/min | INTRAVENOUS | Status: DC

## 2022-09-17 MED ORDER — ONDANSETRON HCL 4 MG/2ML IJ SOLN: 4.0000 mg | INTRAMUSCULAR | Status: DC | PRN

## 2022-09-17 MED ORDER — SOD CITRATE-CITRIC ACID 500-334 MG/5ML PO SOLN: 30.0000 mL | ORAL | Status: DC | PRN

## 2022-09-17 MED ORDER — LACTATED RINGERS IV SOLN: INTRAVENOUS | Status: DC

## 2022-09-17 MED ORDER — OXYCODONE-ACETAMINOPHEN 5-325 MG PO TABS: 1.0000 | ORAL_TABLET | ORAL | Status: DC | PRN

## 2022-09-17 MED ORDER — DIBUCAINE (PERIANAL) 1 % EX OINT: 1.0000 | TOPICAL_OINTMENT | CUTANEOUS | Status: DC | PRN

## 2022-09-17 MED ORDER — TERBUTALINE SULFATE 1 MG/ML IJ SOLN: 0.2500 mg | Freq: Once | INTRAMUSCULAR | Status: DC | PRN

## 2022-09-17 MED ORDER — IBUPROFEN 600 MG PO TABS
600.0000 mg | ORAL_TABLET | Freq: Four times a day (QID) | ORAL | Status: DC
  Administered 2022-09-17 – 2022-09-18 (×5): 600 mg via ORAL
  Filled 2022-09-17 (×5): qty 1

## 2022-09-17 MED ORDER — OXYTOCIN 10 UNIT/ML IJ SOLN
INTRAMUSCULAR | Status: AC
  Filled 2022-09-17: qty 1

## 2022-09-17 MED ORDER — DIPHENHYDRAMINE HCL 25 MG PO CAPS: 25.0000 mg | ORAL_CAPSULE | Freq: Four times a day (QID) | ORAL | Status: DC | PRN

## 2022-09-17 MED ORDER — OXYCODONE-ACETAMINOPHEN 5-325 MG PO TABS: 2.0000 | ORAL_TABLET | ORAL | Status: DC | PRN

## 2022-09-17 MED ORDER — OXYCODONE HCL 5 MG PO TABS: 5.0000 mg | ORAL_TABLET | ORAL | Status: DC | PRN

## 2022-09-17 MED ORDER — OXYTOCIN-SODIUM CHLORIDE 30-0.9 UT/500ML-% IV SOLN: 2.5000 [IU]/h | INTRAVENOUS | Status: DC | PRN

## 2022-09-17 MED ORDER — OXYTOCIN BOLUS FROM INFUSION
333.0000 mL | Freq: Once | INTRAVENOUS | Status: AC
Start: 2022-09-17 — End: 2022-09-17

## 2022-09-17 MED ORDER — ONDANSETRON HCL 4 MG PO TABS: 4.0000 mg | ORAL_TABLET | ORAL | Status: DC | PRN

## 2022-09-17 MED ORDER — ACETAMINOPHEN 325 MG PO TABS
650.0000 mg | ORAL_TABLET | ORAL | Status: DC | PRN
  Administered 2022-09-18: 650 mg via ORAL
  Filled 2022-09-17: qty 2

## 2022-09-17 MED ORDER — METHYLERGONOVINE MALEATE 0.2 MG/ML IJ SOLN: 0.2000 mg | INTRAMUSCULAR | Status: DC | PRN

## 2022-09-17 MED ORDER — OXYTOCIN-SODIUM CHLORIDE 30-0.9 UT/500ML-% IV SOLN
2.5000 [IU]/h | INTRAVENOUS | Status: DC
  Administered 2022-09-17: 2.5 [IU]/h via INTRAVENOUS

## 2022-09-17 MED ORDER — OXYCODONE HCL 5 MG PO TABS: 10.0000 mg | ORAL_TABLET | ORAL | Status: DC | PRN

## 2022-09-17 MED ORDER — ACETAMINOPHEN 325 MG PO TABS
650.0000 mg | ORAL_TABLET | ORAL | Status: DC | PRN
  Administered 2022-09-17 (×2): 650 mg via ORAL
  Filled 2022-09-17 (×2): qty 2

## 2022-09-17 NOTE — H&P (Signed)
Kendra Horne is a 37 y.o. female G3P1102 at 18 6/7 weeks (EDD 09/25/22 by LMP c/w 9 week [place]) presented to hospital in advance labor and on labor and delivery c/c/+1 with urge to push.    Prenatal care significant for: 1) Smoker  1/2 PPD, plan growth [place] 32 weeks   2) Large fetus  1/12 MFM growth: EFW 95% (7lb6oz = 3340g), AFI 14.9cm, vertex.   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 Past Surgical History:  Procedure Laterality Date   lung collapsed Left    Family History: family history includes Diabetes in her father. Social History:  reports that she has been smoking cigarettes. She does not have any smokeless tobacco history on file. She reports that she does not currently use alcohol. She reports that she does not use drugs.     Maternal Diabetes: No Genetic Screening: Normal Maternal Ultrasounds/Referrals: Other:  Possible LGA Fetal Ultrasounds or other Referrals:  Referred to Materal Fetal Medicine  Maternal Substance Abuse:  Yes:  Type: Smoker Significant Maternal Medications:  None Significant Maternal Lab Results:  Group B Strep negative and Rh negative Number of Prenatal Visits:greater than 3 verified prenatal visits Other Comments:  None  Review of Systems  Constitutional:  Negative for fever.  Gastrointestinal:  Positive for abdominal pain.  Genitourinary:  Negative for vaginal bleeding.   Maternal Medical History:  Reason for admission: Rupture of  membranes and contractions.   Contractions: Frequency: regular.   Perceived severity is strong.   Fetal activity: Perceived fetal activity is normal.   Prenatal Complications - Diabetes: none.   Dilation: 10 Effacement (%): 100 Station: 0, Plus 1 Exam by:: Youlanda Roys, RN Temperature (!) 96.8 F (36 C), temperature source Axillary, last menstrual period 12/19/2021, unknown if currently breastfeeding. Maternal Exam:  Uterine Assessment: Contraction strength is firm.  Contraction frequency is regular.  Abdomen: Patient reports no abdominal tenderness. Fetal presentation: vertex Introitus: Normal vulva. Normal vagina.    Physical Exam Cardiovascular:     Rate and Rhythm: Normal rate and regular rhythm.  Pulmonary:     Effort: Pulmonary effort is normal.  Abdominal:     Palpations: Abdomen is soft.  Genitourinary:    General: Normal vulva.  Neurological:     General: No focal deficit present.     Mental Status: She is alert.  Psychiatric:        Mood and Affect: Mood normal.     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  Please see delivery note for details.    Oda Kilts  Kendra Horne 09/17/2022, 7:05 AM

## 2022-09-17 NOTE — MAU Note (Incomplete)
Pt says SROM -

## 2022-09-17 NOTE — Progress Notes (Signed)
Post Partum Day 0 Pt up and alert in pp room, normal lochia D/w pt circumcision procedure in detail and risks/benefits reviewed--pt desires    Logan Bores, MD 09/17/2022, 10:45 AM

## 2022-09-17 NOTE — Progress Notes (Signed)
SVD baby boy skin to skin with mother

## 2022-09-18 LAB — CBC
HCT: 34 % — ABNORMAL LOW (ref 36.0–46.0)
Hemoglobin: 11.2 g/dL — ABNORMAL LOW (ref 12.0–15.0)
MCH: 31.3 pg (ref 26.0–34.0)
MCHC: 32.9 g/dL (ref 30.0–36.0)
MCV: 95 fL (ref 80.0–100.0)
Platelets: 290 10*3/uL (ref 150–400)
RBC: 3.58 MIL/uL — ABNORMAL LOW (ref 3.87–5.11)
RDW: 13.7 % (ref 11.5–15.5)
WBC: 14.7 10*3/uL — ABNORMAL HIGH (ref 4.0–10.5)
nRBC: 0 % (ref 0.0–0.2)

## 2022-09-18 LAB — RPR: RPR Ser Ql: NONREACTIVE

## 2022-09-18 MED ORDER — ACETAMINOPHEN 325 MG PO TABS: 650.0000 mg | ORAL_TABLET | ORAL | 0 refills | Status: AC | PRN

## 2022-09-18 MED ORDER — IBUPROFEN 600 MG PO TABS: 600.0000 mg | ORAL_TABLET | Freq: Four times a day (QID) | ORAL | 0 refills | Status: AC

## 2022-09-18 MED ORDER — RHO D IMMUNE GLOBULIN 1500 UNIT/2ML IJ SOSY
300.0000 ug | PREFILLED_SYRINGE | Freq: Once | INTRAMUSCULAR | Status: AC
  Administered 2022-09-18: 300 ug via INTRAVENOUS
  Filled 2022-09-18: qty 2

## 2022-09-18 NOTE — Progress Notes (Signed)
Post Partum Day 1 Subjective: no complaints, up ad lib, and tolerating PO  She would like d/c home today  Objective: Blood pressure 102/69, pulse 76, temperature 97.9 F (36.6 C), temperature source Oral, resp. rate 18, last menstrual period 12/19/2021, unknown if currently breastfeeding.  Physical Exam:  General: alert and cooperative Lochia: appropriate Uterine Fundus: firm   Recent Labs    09/17/22 0815 09/18/22 0435  HGB 12.0 11.2*  HCT 36.2 34.0*    Assessment/Plan: Discharge home if baby able to go Circumcision post care reviewed    LOS: 1 day   Logan Bores, MD 09/18/2022, 7:42 AM

## 2022-09-18 NOTE — Discharge Summary (Signed)
Postpartum Discharge Summary       Patient Name: Kendra Horne DOB: May 07, 1986 MRN: [id number]  Date of admission: 09/17/2022 Delivery date:09/17/2022  Delivering provider: Vanessa Kick  Date of discharge: 09/18/2022  Admitting diagnosis: Large for gestational age fetus affecting management of mother [O36.60X0] Spontaneous vaginal delivery [O80] Intrauterine pregnancy: [redacted]w[redacted]d     Secondary diagnosis:  Principal Problem:   Large for gestational age fetus affecting management of mother Active Problems:   Spontaneous vaginal delivery  Additional problems: Precipitous delivery    Discharge diagnosis: Term Pregnancy Delivered                                              Post partum procedures:rhogam Augmentation: N/A Complications: None  Hospital course: Onset of Labor With Vaginal Delivery      37 y.o. yo G3P2003 at [redacted]w[redacted]d was admitted in Active Labor on 09/17/2022. Labor course was uncomplicated. Membrane Rupture Time/Date: 6:00 AM ,09/17/2022   Delivery Method:Vaginal, Spontaneous  Episiotomy: None  Lacerations:  2nd degree  Patient had an uncomplicated postpartum course. She is ambulating, tolerating a regular diet, passing flatus, and urinating well. Patient is discharged home in stable condition on 09/18/22.  Newborn Data: Birth date:09/17/2022  Birth time:7:18 AM  Gender:Female  Living status:Living  Apgars:9 ,9  Weight:3530 g     Physical exam  Vitals:   09/17/22 1430 09/17/22 1853 09/17/22 2151 09/18/22 0527  BP: 104/62 115/76 (!) 108/59 102/69  Pulse: 79 69 79 76  Resp: 16 17 18 18   Temp: 98.2 F (36.8 C) 98.6 F (37 C) 98.1 F (36.7 C) 97.9 F (36.6 C)  TempSrc: Oral Oral Oral Oral   General: alert and cooperative Lochia: appropriate Uterine Fundus: firm  Labs: Lab Results  Component Value Date   WBC 14.7 (H) 09/18/2022   HGB 11.2 (L) 09/18/2022   HCT 34.0 (L) 09/18/2022   MCV 95.0 09/18/2022   PLT 290 09/18/2022      Latest Ref Rng & Units  08/29/2014    3:19 PM  CMP  Glucose 65 - 99 mg/dL 86   BUN 7 - 18 mg/dL 12   Creatinine 0.60 - 1.30 mg/dL 0.73   Sodium 136 - 145 mmol/L 137   Potassium 3.5 - 5.1 mmol/L 3.9   Chloride 98 - 107 mmol/L 105   CO2 21 - 32 mmol/L 26   Calcium 8.5 - 10.1 mg/dL 8.6    Edinburgh Score:    09/17/2022    9:22 AM  Edinburgh Postnatal Depression Scale Screening Tool  I have been able to laugh and see the funny side of things. 0  I have looked forward with enjoyment to things. 0  I have blamed myself unnecessarily when things went wrong. 0  I have been anxious or worried for no good reason. 0  I have felt scared or panicky for no good reason. 0  Things have been getting on top of me. 0  I have been so unhappy that I have had difficulty sleeping. 0  I have felt sad or miserable. 0  I have been so unhappy that I have been crying. 0  The thought of harming myself has occurred to me. 0  Edinburgh Postnatal Depression Scale Total 0     After visit meds:  Allergies as of 09/18/2022   No Known Allergies  Medication List     STOP taking these medications    aspirin EC 81 MG tablet       TAKE these medications    acetaminophen 325 MG tablet Commonly known as: TYLENOL Take 2 tablets (650 mg total) by mouth every 4 (four) hours as needed (for pain scale < 4  OR  temperature  >/=  100.5 F).   ibuprofen 600 MG tablet Commonly known as: ADVIL Take 1 tablet (600 mg total) by mouth every 6 (six) hours.   prenatal multivitamin Tabs tablet Take 1 tablet by mouth daily at 12 noon.         Discharge home in stable condition Infant Feeding: Bottle Infant Disposition:home with mother Discharge instruction: per After Visit Summary and Postpartum booklet. Activity: Advance as tolerated. Pelvic rest for 6 weeks.  Diet: routine diet Future Appointments:No future appointments. Follow up Visit:  Follow-up Information     Vanessa Kick, MD. Schedule an appointment as soon as possible  for a visit in 4 week(s).   Specialty: Obstetrics and Gynecology Why: Routine Postpartum and depo provera Contact information: [address] [place] [address] [phone]                  Please schedule this patient for a In person postpartum visit in 4 weeks with the following provider: MD.  Delivery mode:  Vaginal, Spontaneous  Anticipated Birth Control:  Depo   09/18/2022 Kendra Bores, MD

## 2022-09-19 ENCOUNTER — Inpatient Hospital Stay (HOSPITAL_COMMUNITY): Payer: Medicaid Other

## 2022-09-19 ENCOUNTER — Encounter (HOSPITAL_COMMUNITY): Payer: Self-pay

## 2022-09-19 LAB — RH IG WORKUP (INCLUDES ABO/RH)
Fetal Screen: NEGATIVE
Gestational Age(Wks): 38.6
Unit division: 0

## 2022-09-28 ENCOUNTER — Telehealth (HOSPITAL_COMMUNITY): Payer: Self-pay | Admitting: *Deleted

## 2022-09-28 NOTE — Telephone Encounter (Signed)
Attempted hospital discharge follow-up call. Left message for patient to return RN call with any questions or concerns. Erline Levine, RN, 09/28/22, [phone]
